# Patient Record
Sex: Female | Born: 1970 | Race: Black or African American | Hispanic: No | Marital: Single | Smoking: Current some day smoker
Health system: Southern US, Community
[De-identification: ages and names within clinical notes are randomized; demographics above are authoritative.]

## PROBLEM LIST (undated history)

## (undated) DIAGNOSIS — I341 Nonrheumatic mitral (valve) prolapse: Secondary | ICD-10-CM

## (undated) HISTORY — PX: TUMOR REMOVAL: SHX12

## (undated) HISTORY — DX: Nonrheumatic mitral (valve) prolapse: I34.1

---

## 1999-05-04 ENCOUNTER — Other Ambulatory Visit: Admission: RE | Admit: 1999-05-04 | Discharge: 1999-05-04 | Payer: Self-pay | Admitting: Family Medicine

## 2001-10-31 ENCOUNTER — Other Ambulatory Visit: Admission: RE | Admit: 2001-10-31 | Discharge: 2001-10-31 | Payer: Self-pay | Admitting: Family Medicine

## 2003-09-27 ENCOUNTER — Emergency Department (HOSPITAL_COMMUNITY): Admission: AD | Admit: 2003-09-27 | Discharge: 2003-09-28 | Payer: Self-pay | Admitting: Emergency Medicine

## 2003-12-09 ENCOUNTER — Other Ambulatory Visit: Admission: RE | Admit: 2003-12-09 | Discharge: 2003-12-09 | Payer: Self-pay | Admitting: Family Medicine

## 2004-12-21 ENCOUNTER — Other Ambulatory Visit: Admission: RE | Admit: 2004-12-21 | Discharge: 2004-12-21 | Payer: Self-pay | Admitting: Family Medicine

## 2008-02-09 ENCOUNTER — Other Ambulatory Visit: Admission: RE | Admit: 2008-02-09 | Discharge: 2008-02-09 | Payer: Self-pay | Admitting: Family Medicine

## 2009-02-20 ENCOUNTER — Other Ambulatory Visit: Admission: RE | Admit: 2009-02-20 | Discharge: 2009-02-20 | Payer: Self-pay | Admitting: Family Medicine

## 2010-05-26 ENCOUNTER — Other Ambulatory Visit: Admission: RE | Admit: 2010-05-26 | Discharge: 2010-05-26 | Payer: Self-pay | Admitting: Family Medicine

## 2012-04-21 ENCOUNTER — Other Ambulatory Visit (HOSPITAL_COMMUNITY)
Admission: RE | Admit: 2012-04-21 | Discharge: 2012-04-21 | Disposition: A | Discharge status: Home / Self Care | Payer: PRIVATE HEALTH INSURANCE | Source: Ambulatory Visit | Attending: Family Medicine | Admitting: Family Medicine

## 2012-04-21 ENCOUNTER — Other Ambulatory Visit: Payer: Self-pay | Admitting: Family Medicine

## 2012-04-21 DIAGNOSIS — Z113 Encounter for screening for infections with a predominantly sexual mode of transmission: Secondary | ICD-10-CM | POA: Insufficient documentation

## 2012-04-21 DIAGNOSIS — Z124 Encounter for screening for malignant neoplasm of cervix: Secondary | ICD-10-CM | POA: Insufficient documentation

## 2014-05-31 ENCOUNTER — Other Ambulatory Visit: Payer: Self-pay

## 2014-05-31 DIAGNOSIS — Z1231 Encounter for screening mammogram for malignant neoplasm of breast: Secondary | ICD-10-CM

## 2014-06-12 ENCOUNTER — Ambulatory Visit
Admission: RE | Admit: 2014-06-12 | Discharge: 2014-06-12 | Disposition: A | Discharge status: Home / Self Care | Payer: PRIVATE HEALTH INSURANCE | Source: Ambulatory Visit

## 2014-06-12 DIAGNOSIS — Z1231 Encounter for screening mammogram for malignant neoplasm of breast: Secondary | ICD-10-CM

## 2015-06-10 ENCOUNTER — Other Ambulatory Visit: Payer: Self-pay | Admitting: Family Medicine

## 2015-06-10 ENCOUNTER — Other Ambulatory Visit (HOSPITAL_COMMUNITY)
Admission: RE | Admit: 2015-06-10 | Discharge: 2015-06-10 | Disposition: A | Discharge status: Home / Self Care | Payer: PRIVATE HEALTH INSURANCE | Source: Ambulatory Visit | Attending: Family Medicine | Admitting: Family Medicine

## 2015-06-10 DIAGNOSIS — Z124 Encounter for screening for malignant neoplasm of cervix: Secondary | ICD-10-CM | POA: Diagnosis not present

## 2015-06-11 LAB — CYTOLOGY - PAP

## 2017-06-22 ENCOUNTER — Other Ambulatory Visit: Payer: Self-pay | Admitting: Family Medicine

## 2017-06-22 DIAGNOSIS — Z1231 Encounter for screening mammogram for malignant neoplasm of breast: Secondary | ICD-10-CM

## 2017-07-07 ENCOUNTER — Ambulatory Visit
Admission: RE | Admit: 2017-07-07 | Discharge: 2017-07-07 | Disposition: A | Discharge status: Home / Self Care | Payer: PRIVATE HEALTH INSURANCE | Source: Ambulatory Visit | Attending: Family Medicine | Admitting: Family Medicine

## 2017-07-07 DIAGNOSIS — Z1231 Encounter for screening mammogram for malignant neoplasm of breast: Secondary | ICD-10-CM

## 2017-07-08 ENCOUNTER — Ambulatory Visit: Payer: PRIVATE HEALTH INSURANCE

## 2018-06-09 ENCOUNTER — Other Ambulatory Visit: Payer: Self-pay | Admitting: Family Medicine

## 2018-06-09 DIAGNOSIS — Z1231 Encounter for screening mammogram for malignant neoplasm of breast: Secondary | ICD-10-CM

## 2018-07-21 ENCOUNTER — Ambulatory Visit
Admission: RE | Admit: 2018-07-21 | Discharge: 2018-07-21 | Disposition: A | Discharge status: Home / Self Care | Payer: PRIVATE HEALTH INSURANCE | Source: Ambulatory Visit | Attending: Family Medicine | Admitting: Family Medicine

## 2018-07-21 ENCOUNTER — Encounter: Payer: Self-pay | Admitting: Radiology

## 2018-07-21 DIAGNOSIS — Z1231 Encounter for screening mammogram for malignant neoplasm of breast: Secondary | ICD-10-CM

## 2018-07-25 ENCOUNTER — Other Ambulatory Visit: Payer: Self-pay | Admitting: Family Medicine

## 2018-07-25 ENCOUNTER — Other Ambulatory Visit (HOSPITAL_COMMUNITY)
Admission: RE | Admit: 2018-07-25 | Discharge: 2018-07-25 | Disposition: A | Discharge status: Home / Self Care | Payer: PRIVATE HEALTH INSURANCE | Source: Ambulatory Visit | Attending: Family Medicine | Admitting: Family Medicine

## 2018-07-25 DIAGNOSIS — Z124 Encounter for screening for malignant neoplasm of cervix: Secondary | ICD-10-CM | POA: Insufficient documentation

## 2018-07-27 LAB — CYTOLOGY - PAP: Diagnosis: NEGATIVE

## 2019-09-13 ENCOUNTER — Other Ambulatory Visit: Payer: Self-pay | Admitting: Family Medicine

## 2019-09-13 DIAGNOSIS — Z1231 Encounter for screening mammogram for malignant neoplasm of breast: Secondary | ICD-10-CM

## 2019-10-22 ENCOUNTER — Other Ambulatory Visit: Payer: Self-pay

## 2019-10-22 ENCOUNTER — Ambulatory Visit
Admission: RE | Admit: 2019-10-22 | Discharge: 2019-10-22 | Disposition: A | Discharge status: Home / Self Care | Payer: PRIVATE HEALTH INSURANCE | Source: Ambulatory Visit | Attending: Family Medicine | Admitting: Family Medicine

## 2019-10-22 DIAGNOSIS — Z1231 Encounter for screening mammogram for malignant neoplasm of breast: Secondary | ICD-10-CM

## 2020-10-09 ENCOUNTER — Other Ambulatory Visit: Payer: Self-pay | Admitting: Internal Medicine

## 2020-10-09 DIAGNOSIS — Z1231 Encounter for screening mammogram for malignant neoplasm of breast: Secondary | ICD-10-CM

## 2020-11-26 ENCOUNTER — Ambulatory Visit
Admission: RE | Admit: 2020-11-26 | Discharge: 2020-11-26 | Disposition: A | Discharge status: Home / Self Care | Payer: PRIVATE HEALTH INSURANCE | Source: Ambulatory Visit | Attending: Internal Medicine | Admitting: Internal Medicine

## 2020-11-26 ENCOUNTER — Other Ambulatory Visit: Payer: Self-pay

## 2020-11-26 DIAGNOSIS — Z1231 Encounter for screening mammogram for malignant neoplasm of breast: Secondary | ICD-10-CM

## 2021-03-23 ENCOUNTER — Other Ambulatory Visit: Payer: Self-pay

## 2021-03-23 ENCOUNTER — Other Ambulatory Visit (HOSPITAL_BASED_OUTPATIENT_CLINIC_OR_DEPARTMENT_OTHER): Payer: Self-pay

## 2021-03-23 ENCOUNTER — Emergency Department (HOSPITAL_BASED_OUTPATIENT_CLINIC_OR_DEPARTMENT_OTHER): Payer: No Typology Code available for payment source

## 2021-03-23 ENCOUNTER — Emergency Department (HOSPITAL_BASED_OUTPATIENT_CLINIC_OR_DEPARTMENT_OTHER)
Admission: EM | Admit: 2021-03-23 | Discharge: 2021-03-23 | Disposition: A | Discharge status: Home / Self Care | Payer: No Typology Code available for payment source | Attending: Emergency Medicine | Admitting: Emergency Medicine

## 2021-03-23 ENCOUNTER — Encounter (HOSPITAL_BASED_OUTPATIENT_CLINIC_OR_DEPARTMENT_OTHER): Payer: Self-pay

## 2021-03-23 DIAGNOSIS — F17210 Nicotine dependence, cigarettes, uncomplicated: Secondary | ICD-10-CM | POA: Insufficient documentation

## 2021-03-23 DIAGNOSIS — U071 COVID-19: Secondary | ICD-10-CM | POA: Diagnosis not present

## 2021-03-23 DIAGNOSIS — R059 Cough, unspecified: Secondary | ICD-10-CM | POA: Diagnosis present

## 2021-03-23 LAB — BASIC METABOLIC PANEL
Anion gap: 5 (ref 5–15)
BUN: 10 mg/dL (ref 6–20)
CO2: 26 mmol/L (ref 22–32)
Calcium: 8.7 mg/dL — ABNORMAL LOW (ref 8.9–10.3)
Chloride: 104 mmol/L (ref 98–111)
Creatinine, Ser: 0.79 mg/dL (ref 0.44–1.00)
GFR, Estimated: >60 mL/min (ref >60)
Glucose, Bld: 99 mg/dL (ref 70–99)
Potassium: 3.9 mmol/L (ref 3.5–5.1)
Sodium: 135 mmol/L (ref 135–145)

## 2021-03-23 MED ORDER — BENZONATATE 100 MG PO CAPS: 100.0000 mg | ORAL_CAPSULE | Freq: Three times a day (TID) | ORAL | 0 refills | Status: DC

## 2021-03-23 MED ORDER — NIRMATRELVIR/RITONAVIR (PAXLOVID)TABLET: 3.0000 | ORAL_TABLET | Freq: Two times a day (BID) | ORAL | 0 refills | Status: DC

## 2021-03-23 MED ORDER — NIRMATRELVIR/RITONAVIR (PAXLOVID)TABLET
3.0000 | ORAL_TABLET | Freq: Two times a day (BID) | ORAL | 0 refills | Status: AC
  Filled 2021-03-23: qty 30, 5d supply, fill #0

## 2021-03-23 MED ORDER — BENZONATATE 100 MG PO CAPS
100.0000 mg | ORAL_CAPSULE | Freq: Three times a day (TID) | ORAL | 0 refills | Status: DC
  Filled 2021-03-23: qty 21, 7d supply, fill #0

## 2021-03-23 NOTE — Discharge Instructions (Signed)
Please take the Paxil Oved twice daily for the next 5 days.  This medicine will help resolve symptoms with COVID, although it will not make the virus clear quicker.  He may also take the [facility] every 8 hours as needed for cough.  Continue to drink hot tea as that will soothe your throat.  Continue to take Tylenol as needed for body aches and fever.

## 2021-03-23 NOTE — ED Triage Notes (Signed)
Pt c/o cough, fever x 2 days-states she tested +covid today-pt states she is concerned she has "mitral valve prolapse"-NAD-steady gait

## 2021-03-23 NOTE — ED Provider Notes (Signed)
[facility] EMERGENCY DEPARTMENT Provider Note   CSN: [id number] Arrival date & time: 03/23/21  1543     History Chief Complaint  Patient presents with   Covid Positive    Kari Thompson is a 50 y.o. female.  HPI  Patient presents with cough, body aches x2 days.  Reports feeling lightheaded for about 10 minutes early this morning when she first got out of bed.  She has history of mitral valve prolapse which concerned her, but she is not having any chest pain or difficulty breathing.  Tylenol helps with body aches.  No chest pain, no leg swelling.  No nausea.  She is COVID vaccinated, but not boosted.  States she tested positive for COVID earlier this morning.  History reviewed. No pertinent past medical history.  There are no problems to display for this patient.   History reviewed. No pertinent surgical history.   OB History   No obstetric history on file.     Family History  Problem Relation Age of Onset   Breast cancer Neg Hx     Social History   Tobacco Use   Smoking status: Some Days    Types: Cigarettes, Cigars   Smokeless tobacco: Never  Substance Use Topics   Alcohol use: Yes    Comment: occ   Drug use: Never    Home Medications Prior to Admission medications   Not on File    Allergies    Patient has no known allergies.  Review of Systems   Review of Systems  Respiratory:  Positive for cough.   Musculoskeletal:  Positive for myalgias.   Physical Exam Updated Vital Signs BP 124/88 (BP Location: Left Arm)   Pulse 76   Temp 98.2 F (36.8 C) (Oral)   Resp 18   Ht 5\' 4"  (1.626 m)   Wt 59 kg   LMP 03/17/2021   SpO2 95%   BMI 22.31 kg/m   Physical Exam Vitals and nursing note reviewed. Exam conducted with a chaperone present.  Constitutional:      General: She is not in acute distress.    Appearance: Normal appearance.  HENT:     Head: Normocephalic and atraumatic.  Eyes:     General: No scleral icterus.    Extraocular  Movements: Extraocular movements intact.     Pupils: Pupils are equal, round, and reactive to light.  Cardiovascular:     Rate and Rhythm: Normal rate and regular rhythm.  Pulmonary:     Effort: Pulmonary effort is normal.     Breath sounds: Normal breath sounds.  Skin:    Coloration: Skin is not jaundiced.  Neurological:     Mental Status: She is alert. Mental status is at baseline.     Coordination: Coordination normal.    ED Results / Procedures / Treatments   Labs (all labs ordered are listed, but only abnormal results are displayed) Labs Reviewed  BASIC METABOLIC PANEL    EKG None  Radiology No results found.  Procedures Procedures   Medications Ordered in ED Medications - No data to display  ED Course  I have reviewed the triage vital signs and the nursing notes.  Pertinent labs & imaging results that were available during my care of the patient were reviewed by me and considered in my medical decision making (see chart for details).  Clinical Course as of 03/23/21 1753  Mon Mar 23, 2021  1740 DG Chest Portable 1 View No pneumonia  [HS]  Clinical Course User Index [HS] Theron Arista, PA-C   MDM Rules/Calculators/A&P                          Will assess for pneumonia on chest x-ray.  We will also check for arrhythmia given she had some transient lightheadedness.  I suspect this is likely due to being dehydrated from the virus.  Patient vitals are stable, chest x-ray does not show signs of pneumonia.  Will prescribe the patient Paxilovid and Tessalon Perles as needed.  Final Clinical Impression(s) / ED Diagnoses Final diagnoses:  None    Rx / DC Orders ED Discharge Orders     None        Theron Arista, [place] 03/23/21 1754    Cathren Laine, MD 03/25/21 1011

## 2021-10-09 ENCOUNTER — Ambulatory Visit
Admission: RE | Admit: 2021-10-09 | Discharge: 2021-10-09 | Disposition: A | Discharge status: Home / Self Care | Payer: No Typology Code available for payment source | Source: Ambulatory Visit | Attending: Sports Medicine | Admitting: Sports Medicine

## 2021-10-09 ENCOUNTER — Other Ambulatory Visit: Payer: Self-pay

## 2021-10-09 ENCOUNTER — Other Ambulatory Visit: Payer: Self-pay | Admitting: Sports Medicine

## 2021-10-09 DIAGNOSIS — M25561 Pain in right knee: Secondary | ICD-10-CM

## 2021-10-09 DIAGNOSIS — M25562 Pain in left knee: Secondary | ICD-10-CM

## 2022-01-28 ENCOUNTER — Other Ambulatory Visit: Payer: Self-pay | Admitting: Family Medicine

## 2022-01-28 DIAGNOSIS — Z1231 Encounter for screening mammogram for malignant neoplasm of breast: Secondary | ICD-10-CM

## 2022-02-04 ENCOUNTER — Ambulatory Visit
Admission: RE | Admit: 2022-02-04 | Discharge: 2022-02-04 | Disposition: A | Discharge status: Home / Self Care | Payer: PRIVATE HEALTH INSURANCE | Source: Ambulatory Visit | Attending: Family Medicine | Admitting: Family Medicine

## 2022-02-04 DIAGNOSIS — Z1231 Encounter for screening mammogram for malignant neoplasm of breast: Secondary | ICD-10-CM

## 2022-07-15 IMAGING — MG MM DIGITAL SCREENING BILAT W/ TOMO AND CAD
8 series · 8 of 24 positions shown · non-contrast
Comparison: Previous exam(s).

CLINICAL DATA: Screening.

EXAM:
DIGITAL SCREENING BILATERAL MAMMOGRAM WITH TOMOSYNTHESIS AND CAD
TECHNIQUE: Bilateral screening digital craniocaudal and mediolateral oblique
mammograms were obtained. Bilateral screening digital breast
tomosynthesis was performed. The images were evaluated with
computer-aided detection.

[R CC synth-2D]
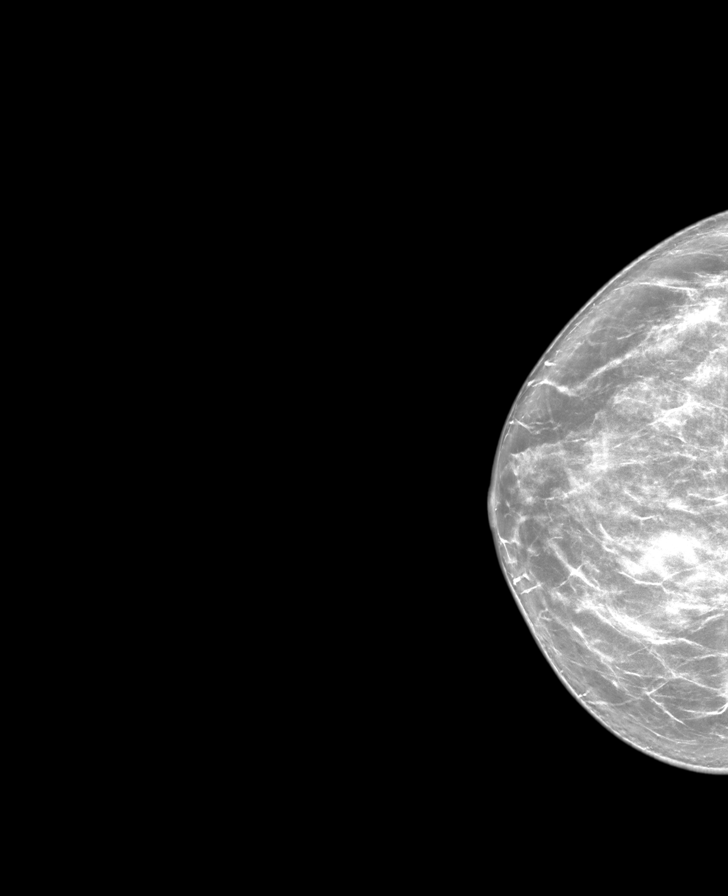

[L CC synth-2D]
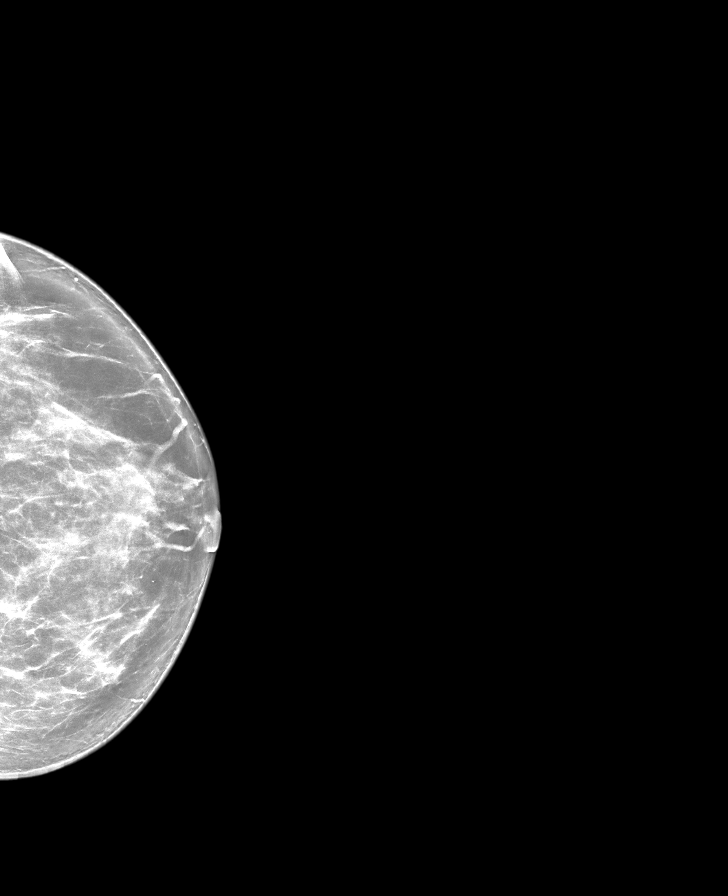

[L MLO synth-2D]
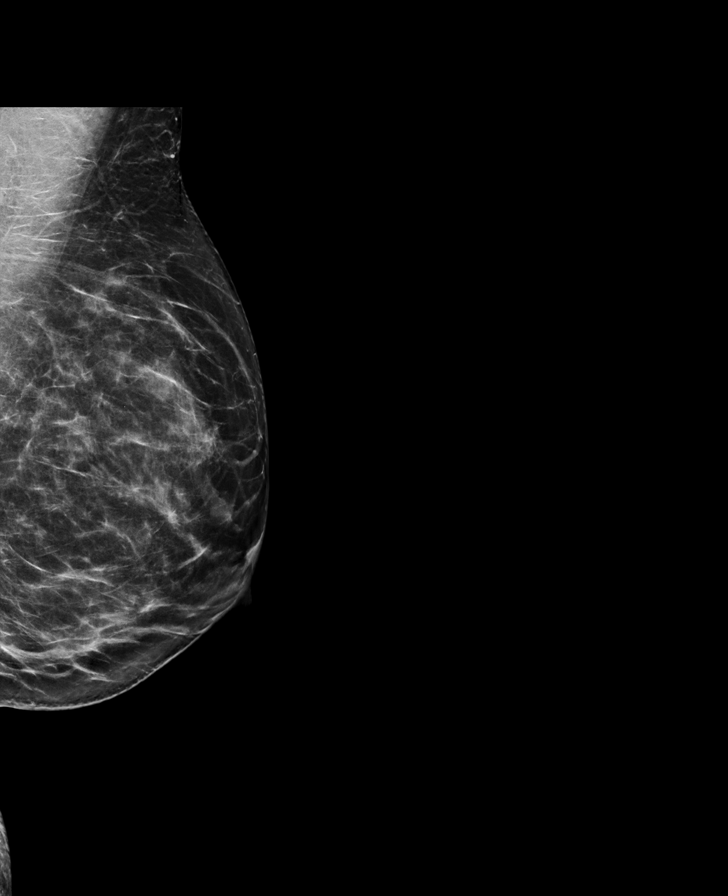

[R MLO synth-2D]
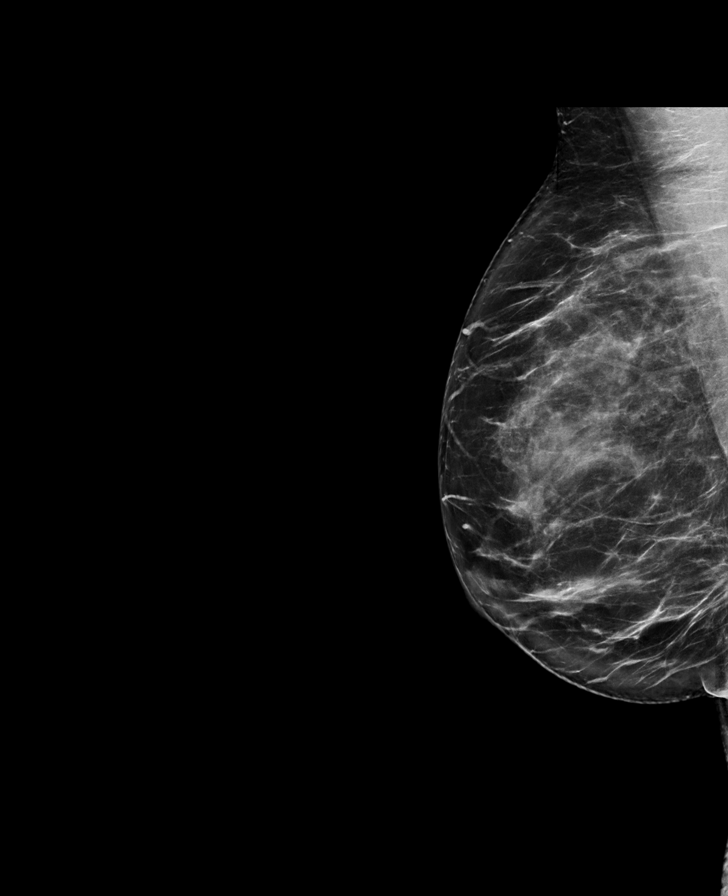

[R CC tomo · tomo slice 40/79.0]
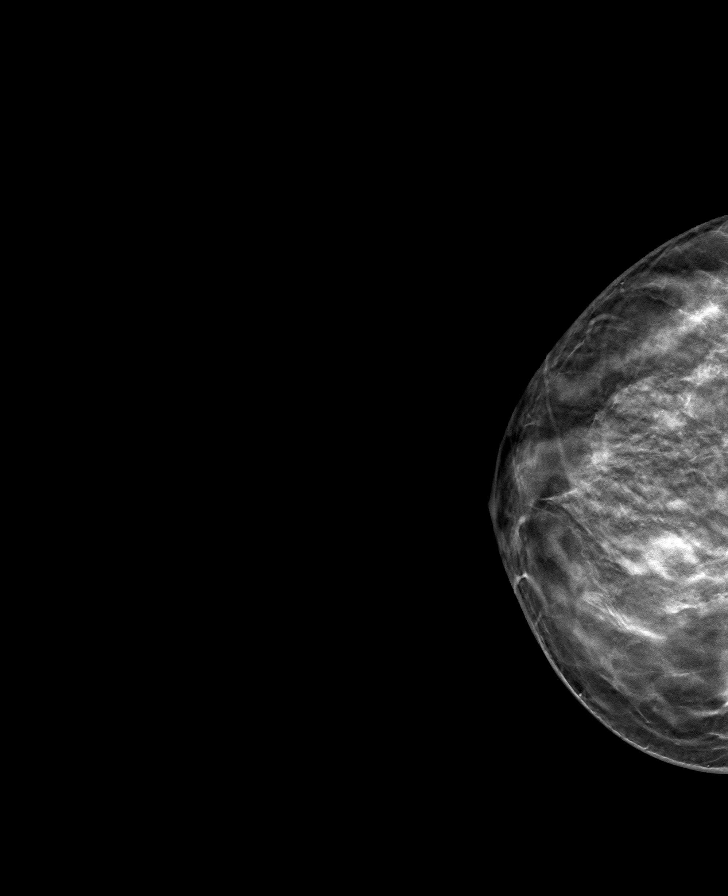

[L MLO tomo · tomo slice 39/77.0]
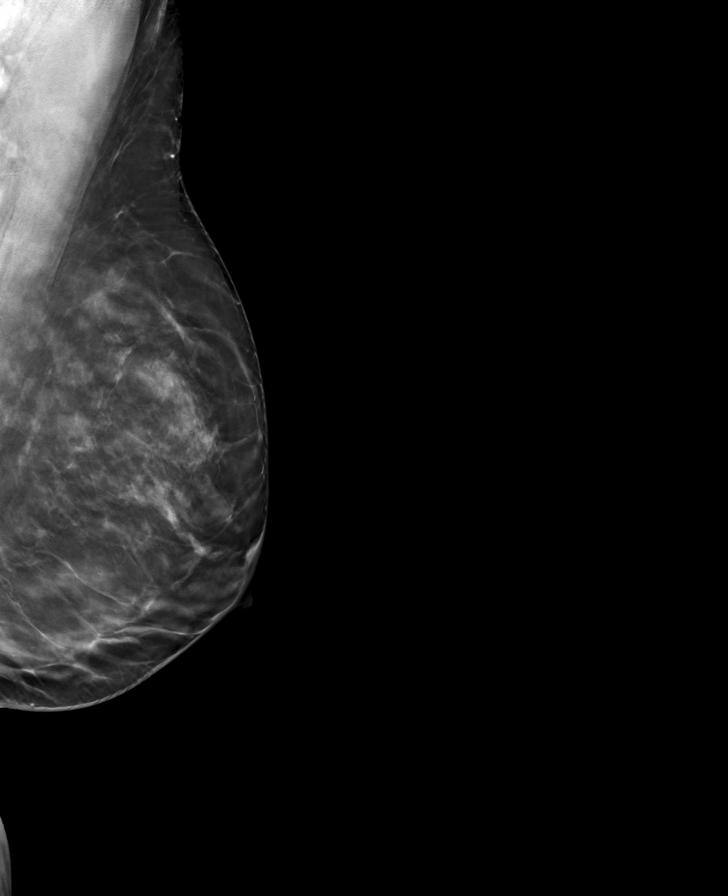

[R MLO tomo · tomo slice 43/85.0]
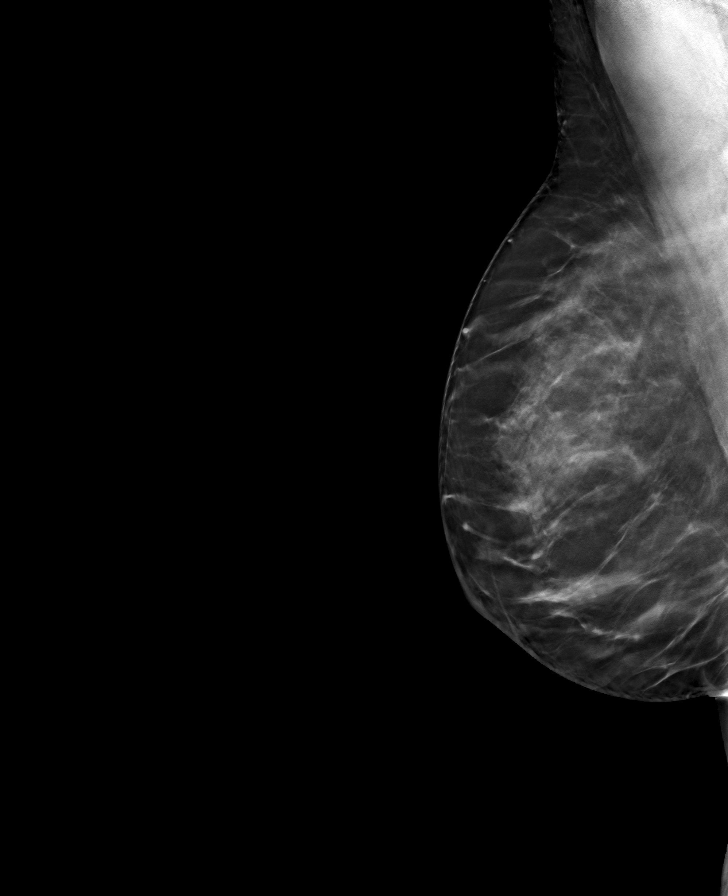

[L CC tomo · tomo slice 41/80.0]
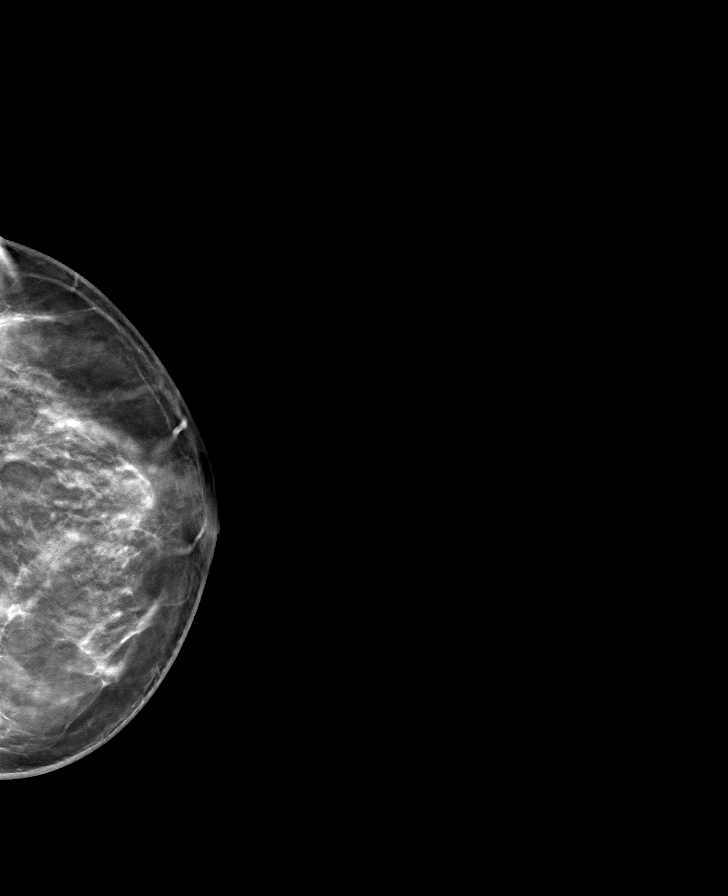

[8 of 24 positions shown; findings below may reference images not displayed]

ACR Breast Density Category c: The breast tissue is heterogeneously
dense, which may obscure small masses.
FINDINGS: There are no findings suspicious for malignancy.
IMPRESSION: No mammographic evidence of malignancy. A result letter of this
screening mammogram will be mailed directly to the patient.

RECOMMENDATION:
Screening mammogram in one year. (Code:[id number])

BI-RADS CATEGORY  1: Negative.

## 2023-02-02 NOTE — Progress Notes (Signed)
NEW PATIENT Date of Service/Encounter:  02/04/23 Referring provider: Ollen Bowl, MD Primary care provider: Maurice Small, MD (Inactive)  Subjective:  Kari Thompson is a 52 y.o. female with a PMHx presenting today for evaluation of chronic rhinitis. History obtained from: chart review and patient and fiance .   Chronic rhinitis:  Symptoms include: watery eyes, itchy eyes, sneezing, before going outside, and if she goes outside, and starts sneezing, spits up mucus, rhinorrhea, PND   Occurs year-round with seasonal flares-spring, used to get a break in the winter, no longer the case. Potential triggers: unknown, grass does tend to be a trigger Treatments tried: allegra, claritin, [facility] brands is current use, she has used Allegra-D, Claritin-D, has tried zyrtec, never tried Xyzal.  Tried flonase. Never tried montelukast. Previous allergy testing: no History of reflux/heartburn:  occasionally due to something she eats, rarely, maybe once per month She has been on prilosec for one month due to reflux, but now resolved. Previous sinus, ear, tonsil, adenoid surgeries: no  Her bug bites turn into big whelps and becomes a sore.  This will then turn into a bruise. Lasts days to weeks.  Chart review:  Referred by [facility] for allergic rhinitis on 12/24/22.  Other allergy screening: Asthma: no Food allergy: no Medication allergy: no Hymenoptera allergy: no Eczema:no History of recurrent infections suggestive of immunodeficency: no Vaccinations are up to date.   Past Medical History: Past Medical History:  Diagnosis Date   Mitral valve prolapse    Medication List:  Current Outpatient Medications  Medication Sig Dispense Refill   ascorbic Acid (VITAMIN C) 500 MG CPCR Take 1 tablet by mouth daily.     Azelastine-Fluticasone (DYMISTA) 137-50 MCG/ACT SUSP Place 1 spray into both nostrils 2 (two) times daily as needed. 23 g 5   Black Elderberry (SAMBUCUS ELDERBERRY) 50  MG/5ML SYRP Take by mouth.     ipratropium (ATROVENT) 0.06 % nasal spray Place 1-2 sprays into both nostrils 3 (three) times daily as needed for rhinitis. 15 mL 5   levocetirizine (XYZAL) 5 MG tablet Take 1 tablet (5 mg total) by mouth daily as needed for allergies. 30 tablet 5   Olopatadine HCl (PATADAY) 0.2 % SOLN Place 1 drop into both eyes daily as needed. 2.5 mL 5   No current facility-administered medications for this visit.   Known Allergies:  No Known Allergies Past Surgical History: Past Surgical History:  Procedure Laterality Date   TUMOR REMOVAL     cheek 1980   Family History: Family History  Problem Relation Age of Onset   Asthma Maternal Aunt    Allergic rhinitis Maternal Aunt    Sinusitis Maternal Aunt    Sinusitis Cousin    Breast cancer Neg Hx    Immunodeficiency Neg Hx    Urticaria Neg Hx    Atopy Neg Hx    Angioedema Neg Hx    Eczema Neg Hx    Social History: Kari Thompson lives in an apartment built 15 years ago, no water damage, carpet floors, gas heating, central AC, multipoo indoors, no roaches, not using dust mite protection on bedding or pillows, occasionally smokes cigars, 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   ROS:  All other systems negative except as noted per HPI.  Objective:  Blood pressure 108/76, pulse 68, temperature 98.1 F (36.7 C), temperature source Temporal, resp. rate 16, height 5' 3.5" (1.613 m),  weight 145 lb (65.8 kg), SpO2 98 %. Body mass index is 25.28 kg/m. Physical Exam: General Appearance:  Alert, cooperative, no distress, appears stated age  Head:  Normocephalic, without obvious abnormality, atraumatic  Eyes:  Conjunctiva clear, EOM's intact  Nose: Nares normal, hypertrophic turbinates, normal mucosa, and no visible anterior polyps  Throat: Lips, tongue normal; teeth and gums normal, normal posterior oropharynx  Neck: Supple, symmetrical   Lungs:   clear to auscultation bilaterally, Respirations unlabored, no coughing  Heart:  regular rate and rhythm and no murmur, Appears well perfused  Extremities: No edema  Skin: Erythematous papules on right forearm and Skin color, texture, turgor normal  Neurologic: No gross deficits   Diagnostics: Skin Testing: Environmental allergy panel.  Adequate controls. Results discussed with patient/family.  Airborne Adult Perc - 02/04/23 1415     Time Antigen Placed 1415    Allergen Manufacturer Waynette Buttery    Location Back    Number of Test 55    Panel 1 Select    1. Control-Buffer 50% Glycerol Negative    2. Control-Histamine 3+    3. Bahia 4+    4. [place] 4+    5. Johnson 3+    6. Kentucky Blue 4+    7. Meadow Fescue 4+    8. Perennial Rye 4+    9. Timothy 4+    10. Ragweed Mix 2+    11. Cocklebur Negative    12. Plantain,  English Negative    13. Baccharis Negative    14. Dog Fennel 3+    15. Gu[place] Thistle 2+    16. Lamb's Quarters 3+    17. Sheep Sorrell 3+    18. Rough Pigweed 3+    19. Marsh Elder, Rough Negative    20. Mugwort, Common Negative    21. Box, Elder 2+    22. Cedar, red Negative    23. Sweet Gum 3+    24. Pecan Pollen 4+    25. Pine Mix 2+    26. Walnut, Black Pollen 2+    27. Red Mulberry Negative    28. Ash Mix 3+    29. Birch Mix 2+    30. Beech American 3+    31. Cottonwood, Eastern 3+    32. Hickory, White 4+    33. Maple Mix 3+    34. Oak, Guinea[place] Mix 3+    35. Sycamore Eastern 3+    36. Alternaria Alternata 3+    37. Cladosporium Herbarum Negative    38. Aspergillus Mix Negative    39. Penicillium Mix Negative    40. Bipolaris Sorokiniana (Helminthosporium) 3+    41. Drechslera Spicifera (Curvularia) Negative    42. Mucor Plumbeus 3+    43. Fusarium Moniliforme 2+    44. Aureobasidium Pullulans (pullulara) Negative    45. Rhizopus Oryzae Negative    46. Botrytis Cinera 2+    47. Epicoccum Nigrum 3+    48. Phoma Betae Negative     49. Dust Mite Mix Negative    50. Cat Hair 10,000 BAU/ml Negative    51.  Dog Epithelia 2+    52. Mixed Feathers 2+    53. Horse Epithelia Negative    54. Cockroach, German Negative    55. Tobacco Leaf 2+             Intradermal - 02/04/23 1502     Time Antigen Placed 1503    Allergen Manufacturer Other    Location Arm  Number of Test 5    Control Negative    Mold 2 3+    Mite Mix 2+    Cat Negative    Cockroach Negative            Allergy testing results were read and interpreted by myself, documented by clinical staff.  Assessment and Plan  Chronic Rhinitis: determined to be Seasonal and Perennial Allergic: - allergy testing today: positive to grass, weed, tree pollen, dogs, dust mites, indoor/outdoor molds, mixed feathers, tobacco leaf - Prevention:  - allergen avoidance when possible - consider allergy shots as long term control of your symptoms by teaching your immune system to be more tolerant of your allergy triggers - Symptom control: - Start Dymista 1 spray each nostril up to twice daily as needed. Will replace flonase. Use for congestion/itchy nose/runny nose/sneezing. - Start Atrovent (Ipratropium Bromide) 1-2 sprays in each nostril up to 3 times a day as needed for runny nose/post nasal drip/drainage.   - Use less frequently if airway gets too dry. - Continue Antihistamine: daily or daily as needed.   -Options include Zyrtec (Cetirizine) 10mg , Claritin (Loratadine) 10mg , Allegra (Fexofenadine) 180mg , or Xyzal (Levocetirinze) 5mg  - Can be purchased over-the-counter if not covered by insurance.  Allergic Conjunctivitis:  - Start Allergy Eye drops-great options include Pataday (Olopatadine) or Zaditor (ketotifen) for eye symptoms daily as needed-both sold over the counter if not covered by insurance.  -Avoid eye drops that say red eye relief as they may contain medications that dry out your eyes.  Reflux, controlled:   - dietary and lifestyle measures,  see below  Mosquito/insect bites Avoidance measures (DEET repellant for mosquitos, long clothing, etc) If bite occurs with raised rash:  - For itch: Topical steroid (hydrocortisone cream) twice daily as needed + oral antihistamine (zyrtec) - For pain and swelling: Oral anti-inflammatory (ibuprofen), ice affected area  Follow up : 3 months, sooner if needed It was a pleasure meeting you in clinic today! Thank you for allowing me to participate in your care.  This note in its entirety was forwarded to the Provider who requested this consultation.  Thank you for your kind referral. I appreciate the opportunity to take part in Kari Thompson's care. Please do not hesitate to contact me with questions.  Sincerely,  Tonny Bollman, MD [facility]

## 2023-02-04 ENCOUNTER — Ambulatory Visit (INDEPENDENT_AMBULATORY_CARE_PROVIDER_SITE_OTHER): Payer: PRIVATE HEALTH INSURANCE | Admitting: Internal Medicine

## 2023-02-04 ENCOUNTER — Encounter: Payer: Self-pay | Admitting: Internal Medicine

## 2023-02-04 VITALS — BP 108/76 | HR 68 | Temp 98.1°F | Resp 16 | Ht 63.5 in | Wt 145.0 lb

## 2023-02-04 DIAGNOSIS — K219 Gastro-esophageal reflux disease without esophagitis: Secondary | ICD-10-CM

## 2023-02-04 DIAGNOSIS — H1013 Acute atopic conjunctivitis, bilateral: Secondary | ICD-10-CM | POA: Diagnosis not present

## 2023-02-04 DIAGNOSIS — Z91038 Other insect allergy status: Secondary | ICD-10-CM

## 2023-02-04 DIAGNOSIS — J3089 Other allergic rhinitis: Secondary | ICD-10-CM | POA: Diagnosis not present

## 2023-02-04 DIAGNOSIS — J302 Other seasonal allergic rhinitis: Secondary | ICD-10-CM | POA: Diagnosis not present

## 2023-02-04 DIAGNOSIS — J310 Chronic rhinitis: Secondary | ICD-10-CM

## 2023-02-04 DIAGNOSIS — W57XXXA Bitten or stung by nonvenomous insect and other nonvenomous arthropods, initial encounter: Secondary | ICD-10-CM

## 2023-02-04 MED ORDER — OLOPATADINE HCL 0.2 % OP SOLN: 1.0000 [drp] | Freq: Every day | OPHTHALMIC | 5 refills | Status: AC | PRN

## 2023-02-04 MED ORDER — LEVOCETIRIZINE DIHYDROCHLORIDE 5 MG PO TABS: 5.0000 mg | ORAL_TABLET | Freq: Every day | ORAL | 5 refills | Status: AC | PRN

## 2023-02-04 MED ORDER — AZELASTINE-FLUTICASONE 137-50 MCG/ACT NA SUSP: 1.0000 | Freq: Two times a day (BID) | NASAL | 5 refills | Status: AC | PRN

## 2023-02-04 MED ORDER — IPRATROPIUM BROMIDE 0.06 % NA SOLN: 1.0000 | Freq: Three times a day (TID) | NASAL | 5 refills | Status: AC | PRN

## 2023-02-04 NOTE — Patient Instructions (Addendum)
Chronic Rhinitis: determined to be Seasonal and Perennial Allergic: - allergy testing today: positive to grass, weed, tree pollen, dogs, dust mites, indoor/outdoor molds, mixed feathers, tobacco leaf - Prevention:  - allergen avoidance when possible - consider allergy shots as long term control of your symptoms by teaching your immune system to be more tolerant of your allergy triggers - Symptom control: - Start Dymista 1 spray each nostril up to twice daily as needed. Will replace flonase. Use for congestion/itchy nose/runny nose/sneezing. - Start Atrovent (Ipratropium Bromide) 1-2 sprays in each nostril up to 3 times a day as needed for runny nose/post nasal drip/drainage.   - Use less frequently if airway gets too dry. - Continue Antihistamine: daily or daily as needed.   -Options include Zyrtec (Cetirizine) 10mg , Claritin (Loratadine) 10mg , Allegra (Fexofenadine) 180mg , or Xyzal (Levocetirinze) 5mg  - Can be purchased over-the-counter if not covered by insurance.  Allergic Conjunctivitis:  - Start Allergy Eye drops-great options include Pataday (Olopatadine) or Zaditor (ketotifen) for eye symptoms daily as needed-both sold over the counter if not covered by insurance.  -Avoid eye drops that say red eye relief as they may contain medications that dry out your eyes.  Reflux, controlled:   - dietary and lifestyle measures, see below  Mosquito/insect bites Avoidance measures (DEET repellant for mosquitos, long clothing, etc) If bite occurs with raised rash:  - For itch: Topical steroid (hydrocortisone cream) twice daily as needed + oral antihistamine (zyrtec) - For pain and swelling: Oral anti-inflammatory (ibuprofen), ice affected area         Follow up : 3 months, sooner if needed It was a pleasure meeting you in clinic today! Thank you for allowing me to participate in your care.  Gastroesophageal Reflux Induced Respiratory Disease and Laryngopharyngeal Reflux  (LPR): Gastroesophageal reflux disease (GERD) is a condition where the contents of the stomach reflux or back up into the esophagus or swallowing tube.  This can result in a variety of clinical symptoms including classic symptoms and atypical symptoms.  Classic symptoms of GERD include: heartburn, chest pain, acid taste in the mouth, and difficulty in swallowing.  Atypical symptoms of GERD include laryngopharyngeal reflux (LPR) and asthma.  LPR occurs when stomach reflux comes all the way up to the throat.  Clinical symptoms include hoarseness, raspy voice, laryngitis, throat clearing, postnasal drip, mucus stuck in the throat, a sensation of a lump in the throat, sore throat, and cough.  Most patients with LPR do not have classic symptoms of GERD.  Asthma can also be triggered by GERD.  The acid stomach fluid can stimulate nerve fibers in the esophagus which can cause an increase in bronchial muscle tone and narrowing of the airways.  Acid stomach contents may also reflux into the trachea and bronchi of the lungs where it can trigger an asthma attack.  Many people with GERD triggered asthma do not have classic symptoms of GERD.  Diagnosis of LPR and GERD induced asthma is frequently made from a typical history and response to medications.  It may take several months of medications to see a good response.  Occasionally, a 24-hour esophageal pH probe study must be performed.  Treatment of GERD/LPR includes:   Modification of diet and lifestyle Stop smoking Avoid overeating and lose weight Avoid acidic and fatty foods, chocolate, onions, garlic, peppermint Elevate the head of your bed 6 to 8 inches with blocks or wedge Medications Zantac, Pepcid, Axid, Tagamet Prilosec, Prevacid, Aciphex, Protonix, Nexium Surgery   Reducing Pollen Exposure  The American Academy of Allergy, Asthma and Immunology suggests the following steps to reduce your exposure to pollen during allergy seasons.    Do not  hang sheets or clothing out to dry; pollen may collect on these items. Do not mow lawns or spend time around freshly cut grass; mowing stirs up pollen. Keep windows closed at night.  Keep car windows closed while driving. Minimize morning activities outdoors, a time when pollen counts are usually at their highest. Stay indoors as much as possible when pollen counts or humidity is high and on windy days when pollen tends to remain in the air longer. Use air conditioning when possible.  Many air conditioners have filters that trap the pollen spores. Use a HEPA room air filter to remove pollen form the indoor air you breathe. Control of Dog or Cat Allergen  Avoidance is the best way to manage a dog or cat allergy. If you have a dog or cat and are allergic to dog or cats, consider removing the dog or cat from the home. If you have a dog or cat but don't want to find it a new home, or if your family wants a pet even though someone in the household is allergic, here are some strategies that may help keep symptoms at bay:  Keep the pet out of your bedroom and restrict it to only a few rooms. Be advised that keeping the dog or cat in only one room will not limit the allergens to that room. Don't pet, hug or kiss the dog or cat; if you do, wash your hands with soap and water. High-efficiency particulate air (HEPA) cleaners run continuously in a bedroom or living room can reduce allergen levels over time. Regular use of a high-efficiency vacuum cleaner or a central vacuum can reduce allergen levels. Giving your dog or cat a bath at least once a week can reduce airborne allergen. DUST MITE AVOIDANCE MEASURES:  There are three main measures that need and can be taken to avoid house dust mites:  Reduce accumulation of dust in general -reduce furniture, clothing, carpeting, books, stuffed animals, especially in bedroom  Separate yourself from the dust -use pillow and mattress encasements (can be found at  stores such as [employer] or online) -avoid direct exposure to air condition flow -use a HEPA filter device, especially in the bedroom; you can also use a HEPA filter vacuum cleaner -wipe dust with a moist towel instead of a dry towel or broom when cleaning  Decrease mites and/or their secretions -wash clothing and linen and stuffed animals at highest temperature possible, at least every 2 weeks -stuffed animals can also be placed in a bag and put in a freezer overnight  Despite the above measures, it is impossible to eliminate dust mites or their allergen completely from your home.  With the above measures the burden of mites in your home can be diminished, with the goal of minimizing your allergic symptoms.  Success will be reached only when implementing and using all means together. Control of Mold Allergen   Mold and fungi can grow on a variety of surfaces provided certain temperature and moisture conditions exist.  Outdoor molds grow on plants, decaying vegetation and soil.  The major outdoor mold, Alternaria and Cladosporium, are found in very high numbers during hot and dry conditions.  Generally, a late Summer - Fall peak is seen for common outdoor fungal spores.  Rain will temporarily lower outdoor mold spore count, but counts  rise rapidly when the rainy period ends.  The most important indoor molds are Aspergillus and Penicillium.  Dark, humid and poorly ventilated basements are ideal sites for mold growth.  The next most common sites of mold growth are the bathroom and the kitchen.  Outdoor (Seasonal) Mold Control  Use air conditioning and keep windows closed Avoid exposure to decaying vegetation. Avoid leaf raking. Avoid grain handling. Consider wearing a face mask if working in moldy areas.    Indoor (Perennial) Mold Control   Maintain humidity below 50%. Clean washable surfaces with 5% bleach solution. Remove sources e.g. contaminated carpets.

## 2023-05-13 ENCOUNTER — Ambulatory Visit: Payer: PRIVATE HEALTH INSURANCE | Admitting: Internal Medicine

## 2023-05-13 ENCOUNTER — Encounter: Payer: Self-pay | Admitting: Internal Medicine

## 2023-05-13 VITALS — BP 110/80 | HR 78 | Temp 97.9°F

## 2023-05-13 DIAGNOSIS — K219 Gastro-esophageal reflux disease without esophagitis: Secondary | ICD-10-CM

## 2023-05-13 DIAGNOSIS — J302 Other seasonal allergic rhinitis: Secondary | ICD-10-CM

## 2023-05-13 DIAGNOSIS — H1013 Acute atopic conjunctivitis, bilateral: Secondary | ICD-10-CM

## 2023-05-13 DIAGNOSIS — J3089 Other allergic rhinitis: Secondary | ICD-10-CM | POA: Diagnosis not present

## 2023-05-13 MED ORDER — EPINEPHRINE 0.3 MG/0.3ML IJ SOAJ: 0.3000 mg | INTRAMUSCULAR | 1 refills | Status: AC | PRN

## 2023-05-13 NOTE — Patient Instructions (Addendum)
Seasonal and Perennial Allergic Rhinitis - allergy testing 2024 positive to grass, weed, tree pollen, dogs, dust mites, indoor/outdoor molds, mixed feathers, tobacco leaf - Prevention:  - allergen avoidance when possible - consider allergy shots as long term control of your symptoms by teaching your immune system to be more tolerant of your allergy triggers - Symptom control: - Continue Dymista 1 spray each nostril up to twice daily as needed. Will replace flonase. Use for congestion/itchy nose/runny nose/sneezing. - Continue Atrovent (Ipratropium Bromide) 1-2 sprays in each nostril up to 3 times a day as needed for runny nose/post nasal drip/drainage.   - Use less frequently if airway gets too dry. - Continue Xyzal (Levocetirinze) 5mg  daily - Can be purchased over-the-counter if not covered by insurance.  Allergic Conjunctivitis:  - Continue Allergy Eye drops-great options include Pataday (Olopatadine) or Zaditor (ketotifen) for eye symptoms daily as needed-both sold over the counter if not covered by insurance.  -Avoid eye drops that say red eye relief as they may contain medications that dry out your eyes.  Reflux, controlled:   - dietary and lifestyle measures, see below  Mosquito/insect bites Avoidance measures (DEET repellant for mosquitos, long clothing, etc) If bite occurs with raised rash:  - For itch: Topical steroid (hydrocortisone cream) twice daily as needed + oral antihistamine (zyrtec) - For pain and swelling: Oral anti-inflammatory (ibuprofen), ice affected area      Follow up : 6 months, sooner if needed Call us when you are ready to schedule RUSH-packet given today. It was a pleasure seeing you again in clinic today! Thank you for allowing me to participate in your care.  Gastroesophageal Reflux Induced Respiratory Disease and Laryngopharyngeal Reflux (LPR): Gastroesophageal reflux disease (GERD) is a condition where the contents of the stomach reflux or back up into  the esophagus or swallowing tube.  This can result in a variety of clinical symptoms including classic symptoms and atypical symptoms.  Classic symptoms of GERD include: heartburn, chest pain, acid taste in the mouth, and difficulty in swallowing.  Atypical symptoms of GERD include laryngopharyngeal reflux (LPR) and asthma.  LPR occurs when stomach reflux comes all the way up to the throat.  Clinical symptoms include hoarseness, raspy voice, laryngitis, throat clearing, postnasal drip, mucus stuck in the throat, a sensation of a lump in the throat, sore throat, and cough.  Most patients with LPR do not have classic symptoms of GERD.  Asthma can also be triggered by GERD.  The acid stomach fluid can stimulate nerve fibers in the esophagus which can cause an increase in bronchial muscle tone and narrowing of the airways.  Acid stomach contents may also reflux into the trachea and bronchi of the lungs where it can trigger an asthma attack.  Many people with GERD triggered asthma do not have classic symptoms of GERD.  Diagnosis of LPR and GERD induced asthma is frequently made from a typical history and response to medications.  It may take several months of medications to see a good response.  Occasionally, a 24-hour esophageal pH probe study must be performed.  Treatment of GERD/LPR includes:   Modification of diet and lifestyle Stop smoking Avoid overeating and lose weight Avoid acidic and fatty foods, chocolate, onions, garlic, peppermint Elevate the head of your bed 6 to 8 inches with blocks or wedge Medications Zantac, Pepcid, Axid, Tagamet Prilosec, Prevacid, Aciphex, Protonix, Nexium Surgery   Reducing Pollen Exposure  The American Academy of Allergy, Asthma and Immunology suggests the following steps to  reduce your exposure to pollen during allergy seasons.    Do not hang sheets or clothing out to dry; pollen may collect on these items. Do not mow lawns or spend time around freshly cut  grass; mowing stirs up pollen. Keep windows closed at night.  Keep car windows closed while driving. Minimize morning activities outdoors, a time when pollen counts are usually at their highest. Stay indoors as much as possible when pollen counts or humidity is high and on windy days when pollen tends to remain in the air longer. Use air conditioning when possible.  Many air conditioners have filters that trap the pollen spores. Use a HEPA room air filter to remove pollen form the indoor air you breathe. Control of Dog or Cat Allergen  Avoidance is the best way to manage a dog or cat allergy. If you have a dog or cat and are allergic to dog or cats, consider removing the dog or cat from the home. If you have a dog or cat but don't want to find it a new home, or if your family wants a pet even though someone in the household is allergic, here are some strategies that may help keep symptoms at bay:  Keep the pet out of your bedroom and restrict it to only a few rooms. Be advised that keeping the dog or cat in only one room will not limit the allergens to that room. Don't pet, hug or kiss the dog or cat; if you do, wash your hands with soap and water. High-efficiency particulate air (HEPA) cleaners run continuously in a bedroom or living room can reduce allergen levels over time. Regular use of a high-efficiency vacuum cleaner or a central vacuum can reduce allergen levels. Giving your dog or cat a bath at least once a week can reduce airborne allergen. DUST MITE AVOIDANCE MEASURES:  There are three main measures that need and can be taken to avoid house dust mites:  Reduce accumulation of dust in general -reduce furniture, clothing, carpeting, books, stuffed animals, especially in bedroom  Separate yourself from the dust -use pillow and mattress encasements (can be found at stores such as [employer] or online) -avoid direct exposure to air condition flow -use a HEPA filter device,  especially in the bedroom; you can also use a HEPA filter vacuum cleaner -wipe dust with a moist towel instead of a dry towel or broom when cleaning  Decrease mites and/or their secretions -wash clothing and linen and stuffed animals at highest temperature possible, at least every 2 weeks -stuffed animals can also be placed in a bag and put in a freezer overnight  Despite the above measures, it is impossible to eliminate dust mites or their allergen completely from your home.  With the above measures the burden of mites in your home can be diminished, with the goal of minimizing your allergic symptoms.  Success will be reached only when implementing and using all means together. Control of Mold Allergen   Mold and fungi can grow on a variety of surfaces provided certain temperature and moisture conditions exist.  Outdoor molds grow on plants, decaying vegetation and soil.  The major outdoor mold, Alternaria and Cladosporium, are found in very high numbers during hot and dry conditions.  Generally, a late Summer - Fall peak is seen for common outdoor fungal spores.  Rain will temporarily lower outdoor mold spore count, but counts rise rapidly when the rainy period ends.  The most important indoor molds  are Aspergillus and Penicillium.  Dark, humid and poorly ventilated basements are ideal sites for mold growth.  The next most common sites of mold growth are the bathroom and the kitchen.  Outdoor (Seasonal) Mold Control  Use air conditioning and keep windows closed Avoid exposure to decaying vegetation. Avoid leaf raking. Avoid grain handling. Consider wearing a face mask if working in moldy areas.    Indoor (Perennial) Mold Control   Maintain humidity below 50%. Clean washable surfaces with 5% bleach solution. Remove sources e.g. contaminated carpets.

## 2023-05-13 NOTE — Progress Notes (Signed)
FOLLOW UP Date of Service/Encounter:  05/13/23  Subjective:  Kari Thompson (DOB: May 30, 1971) is a 52 y.o. female who [facility] in re-evaluation of the following: allergic rhinitis, GERD History obtained from: chart review and patient.  For Review, LV was on 02/04/23  with Dr.Azyah Flett seen for intial visit for allergic rhinitis and conjunctivitis, reflux, and localized reactions to mosquito bites . See below for summary of history and diagnostics.   Therapeutic plans/changes recommended: Start Xyzal, Dymista nasal spray, and Atrovent nasal spray as needed, consider allergy injections. ----------------------------------------------------- Pertinent History/Diagnostics:  Allergic Rhinitis:  watery eyes, itchy eyes, sneezing, before going outside, and if she goes outside, and starts sneezing, spits up mucus, rhinorrhea, PND   Occurs year-round with seasonal flares-spring Tried: allegra, claritin,  Allegra-D, Claritin-D, zyrtec, flonase Current meds: xyzal, dymista, atrovent nasal spray. - SPT environmental panel (02/04/2023): positive to grass, weed, tree pollen, dogs, dust mites, indoor/outdoor molds, mixed feathers, tobacco leaf  Other: reflux, controlled on diet, large local reactions with mosquito bites --------------------------------------------------- Today presents for follow-up. She is taking xyzal every other day usually. Will use dymista when having a bad day. Not having any headaches, eyes are not constantly watery and she is not sneezing as much. Not using eye drops often. She feels xyzal is helpful.  She is interested in starting allergy injections, and would like to start these prior to the following spring.  She would be interested in doing Rush so that she is prepared for springtime next year.  All medications reviewed by clinical staff and updated in chart. No new pertinent medical or surgical history except as noted in HPI.  ROS:  All others negative except as noted per HPI.   Objective:  BP 110/80   Pulse 78   Temp 97.9 F (36.6 C) (Temporal)   SpO2 98%  There is no height or weight on file to calculate BMI. Physical Exam: General Appearance:  Alert, cooperative, no distress, appears stated age  Head:  Normocephalic, without obvious abnormality, atraumatic  Eyes:  Conjunctiva clear, EOM's intact  Ears EACs normal bilaterally and normal TMs bilaterally  Nose: Nares normal, hypertrophic turbinates, normal mucosa, and no visible anterior polyps  Throat: Lips, tongue normal; teeth and gums normal, normal posterior oropharynx  Neck: Supple, symmetrical  Lungs:   clear to auscultation bilaterally, Respirations unlabored, no coughing  Heart:  regular rate and rhythm and no murmur, Appears well perfused  Extremities: No edema  Skin: Skin color, texture, turgor normal and no rashes or lesions on visualized portions of skin  Neurologic: No gross deficits   Labs:  Lab Orders  No laboratory test(s) ordered today   Assessment/Plan   Seasonal and Perennial Allergic Rhinitis-not at goal but improved - allergy testing 2024 positive to grass, weed, tree pollen, dogs, dust mites, indoor/outdoor molds, mixed feathers, tobacco leaf - Prevention:  - allergen avoidance when possible - consider allergy shots as long term control of your symptoms by teaching your immune system to be more tolerant of your allergy triggers - Symptom control: - Continue Dymista 1 spray each nostril up to twice daily as needed. Will replace flonase. Use for congestion/itchy nose/runny nose/sneezing. - Continue Atrovent (Ipratropium Bromide) 1-2 sprays in each nostril up to 3 times a day as needed for runny nose/post nasal drip/drainage.   - Use less frequently if airway gets too dry. - Continue Xyzal (Levocetirinze) 5mg  daily - Can be purchased over-the-counter if not covered by insurance.  Allergic Conjunctivitis:  - Continue Allergy Eye  drops-great options include Pataday (Olopatadine) or Zaditor (ketotifen) for eye symptoms daily as needed-both sold over the counter if not covered by insurance.  -Avoid eye drops that say red eye relief as they may contain medications that dry out your eyes.  Reflux, controlled:   - dietary and lifestyle measures, see below  Mosquito/insect bites-stable Avoidance measures (DEET repellant for mosquitos, long clothing, etc) If bite occurs with raised rash:  - For itch: Topical steroid (hydrocortisone cream) twice daily as needed + oral antihistamine (zyrtec) - For pain and swelling: Oral anti-inflammatory (ibuprofen), ice affected area      Follow up : 6 months, sooner if needed Call us when you are ready to schedule RUSH-packet given today. It was a pleasure seeing you again in clinic today! Thank you for allowing me to participate in your care.  Other:epipen prescribed in anticipation of starting allergy injections. Will need to bring to injection appointments.  Tonny Bollman, MD  [facility]

## 2023-10-24 ENCOUNTER — Other Ambulatory Visit: Payer: Self-pay | Admitting: Internal Medicine

## 2023-10-24 DIAGNOSIS — Z1231 Encounter for screening mammogram for malignant neoplasm of breast: Secondary | ICD-10-CM

## 2023-11-16 ENCOUNTER — Ambulatory Visit
Admission: RE | Admit: 2023-11-16 | Discharge: 2023-11-16 | Disposition: A | Discharge status: Home / Self Care | Payer: PRIVATE HEALTH INSURANCE | Source: Ambulatory Visit | Attending: Internal Medicine

## 2023-11-16 DIAGNOSIS — Z1231 Encounter for screening mammogram for malignant neoplasm of breast: Secondary | ICD-10-CM

## 2024-01-09 ENCOUNTER — Emergency Department (HOSPITAL_BASED_OUTPATIENT_CLINIC_OR_DEPARTMENT_OTHER): Payer: PRIVATE HEALTH INSURANCE

## 2024-01-09 ENCOUNTER — Encounter (HOSPITAL_BASED_OUTPATIENT_CLINIC_OR_DEPARTMENT_OTHER): Payer: Self-pay | Admitting: Emergency Medicine

## 2024-01-09 ENCOUNTER — Emergency Department (HOSPITAL_BASED_OUTPATIENT_CLINIC_OR_DEPARTMENT_OTHER)
Admission: EM | Admit: 2024-01-09 | Discharge: 2024-01-10 | Disposition: A | Discharge status: Home / Self Care | Payer: PRIVATE HEALTH INSURANCE | Attending: Emergency Medicine | Admitting: Emergency Medicine

## 2024-01-09 ENCOUNTER — Other Ambulatory Visit: Payer: Self-pay

## 2024-01-09 DIAGNOSIS — R052 Subacute cough: Secondary | ICD-10-CM | POA: Insufficient documentation

## 2024-01-09 DIAGNOSIS — R7989 Other specified abnormal findings of blood chemistry: Secondary | ICD-10-CM | POA: Insufficient documentation

## 2024-01-09 LAB — COMPREHENSIVE METABOLIC PANEL WITH GFR
ALT: 14 U/L (ref 0–44)
AST: 23 U/L (ref 15–41)
Albumin: 4.3 g/dL (ref 3.5–5.0)
Alkaline Phosphatase: 77 U/L (ref 38–126)
Anion gap: 8 (ref 5–15)
BUN: 12 mg/dL (ref 6–20)
CO2: 26 mmol/L (ref 22–32)
Calcium: 9.8 mg/dL (ref 8.9–10.3)
Chloride: 103 mmol/L (ref 98–111)
Creatinine, Ser: 1.13 mg/dL — ABNORMAL HIGH (ref 0.44–1.00)
GFR, Estimated: 58 mL/min — ABNORMAL LOW (ref >60)
Glucose, Bld: 83 mg/dL (ref 70–99)
Potassium: 3.8 mmol/L (ref 3.5–5.1)
Sodium: 137 mmol/L (ref 135–145)
Total Bilirubin: <0.2 mg/dL (ref 0.0–1.2)
Total Protein: 7.3 g/dL (ref 6.5–8.1)

## 2024-01-09 LAB — CBC WITH DIFFERENTIAL/PLATELET
Abs Immature Granulocytes: 0.01 10*3/uL (ref 0.00–0.07)
Basophils Absolute: 0 10*3/uL (ref 0.0–0.1)
Basophils Relative: 1 %
Eosinophils Absolute: 0.1 10*3/uL (ref 0.0–0.5)
Eosinophils Relative: 2 %
HCT: 37 % (ref 36.0–46.0)
Hemoglobin: 12.2 g/dL (ref 12.0–15.0)
Immature Granulocytes: 0 %
Lymphocytes Relative: 34 %
Lymphs Abs: 1.8 10*3/uL (ref 0.7–4.0)
MCH: 26.1 pg (ref 26.0–34.0)
MCHC: 33 g/dL (ref 30.0–36.0)
MCV: 79.2 fL — ABNORMAL LOW (ref 80.0–100.0)
Monocytes Absolute: 0.6 10*3/uL (ref 0.1–1.0)
Monocytes Relative: 11 %
Neutro Abs: 2.7 10*3/uL (ref 1.7–7.7)
Neutrophils Relative %: 52 %
Platelets: 234 10*3/uL (ref 150–400)
RBC: 4.67 MIL/uL (ref 3.87–5.11)
RDW: 13.5 % (ref 11.5–15.5)
Smear Review: NORMAL
WBC: 5.6 10*3/uL (ref 4.0–10.5)
nRBC: 0 % (ref 0.0–0.2)

## 2024-01-09 LAB — RESP PANEL BY RT-PCR (RSV, FLU A&B, COVID)  RVPGX2
Influenza A by PCR: NEGATIVE
Influenza B by PCR: NEGATIVE
Resp Syncytial Virus by PCR: NEGATIVE
SARS Coronavirus 2 by RT PCR: NEGATIVE

## 2024-01-09 NOTE — ED Triage Notes (Signed)
 Pt was at work retreat one week ago, has felt unwell since. Reports ongoing productive cough.  Denies fever.

## 2024-01-09 NOTE — Discharge Instructions (Addendum)
 You were evaluated in the emergency room for a worsening cough.your lab work and chest x-ray did not show any significant findings.  Your respiratory panel was negative.  You may use Tylenol 1000 mg and/or Motrin 600 mg every 4-6 hours up to 3 times a day for fever or body aches.  Please keep in mind that this dosing is not meant to be continued long-term and that many over-the-counter cough and flu medications contain acetaminophen or ibuprofen. You can expect your current symptoms to linger over the next week or two but please return to the emergency room if you experience any new or worsening symptoms including persistent fevers, worsening productive cough and persistent vomiting. Please follow-up with your primary care provider regarding your ER visit.  It is your responsibility to obtain any results from your visit and review any findings with your primary care doctor.

## 2024-01-09 NOTE — ED Provider Notes (Signed)
  EMERGENCY DEPARTMENT AT [facility] [facility] Provider Note   CSN: [id number] Arrival date & time: 01/09/24  2058     History  Chief Complaint  Patient presents with   Cough    Kari Thompson is a 53 y.o. female who presents with worsening cough.  Patient denies any fevers or chills.  Does endorse congestion and bodyaches.  Cough is described as mostly nonproductive.  No vomiting or diarrhea.  No abdominal pain.  Does not report chest pain or shortness of breath.  History of respiratory issues.   Cough  Past Medical History:  Diagnosis Date   Mitral valve prolapse    Past Surgical History:  Procedure Laterality Date   TUMOR REMOVAL     cheek 1980       Home Medications Prior to Admission medications   Medication Sig Start Date End Date Taking? Authorizing Provider  ascorbic Acid (VITAMIN C) 500 MG CPCR Take 1 tablet by mouth daily.    [provider]  Azelastine -Fluticasone  (DYMISTA ) 137-50 MCG/ACT SUSP Place 1 spray into both nostrils 2 (two) times daily as needed. 02/04/23   Sean Czar, MD  Black Elderberry (SAMBUCUS ELDERBERRY) 50 MG/5ML SYRP Take by mouth. 05/26/21   [provider]  EPINEPHrine  (EPIPEN  2-PAK) 0.3 mg/0.3 mL IJ SOAJ injection Inject 0.3 mg into the muscle as needed for anaphylaxis. 05/13/23   Sean Czar, MD  ipratropium (ATROVENT ) 0.06 % nasal spray Place 1-2 sprays into both nostrils 3 (three) times daily as needed for rhinitis. 02/04/23   Sean Czar, MD  levocetirizine (XYZAL ) 5 MG tablet Take 1 tablet (5 mg total) by mouth daily as needed for allergies. 02/04/23   Sean Czar, MD  Olopatadine  HCl (PATADAY ) 0.2 % SOLN Place 1 drop into both eyes daily as needed. 02/04/23   Sean Czar, MD      Allergies    Patient has no known allergies.    Review of Systems   Review of Systems  Respiratory:  Positive for cough.     Physical Exam Updated Vital Signs BP (!) 141/93   Pulse 84   Temp (!) 96.6 F (35.9 C)    Resp 15   Ht 5' 3.5" (1.613 m)   Wt 64.4 kg   LMP 01/06/2024 (Exact Date)   SpO2 100%   BMI 24.76 kg/m  Physical Exam Vitals and nursing note reviewed.  Constitutional:      General: She is not in acute distress.    Appearance: She is well-developed.  HENT:     Head: Normocephalic and atraumatic.  Eyes:     Conjunctiva/sclera: Conjunctivae normal.  Cardiovascular:     Rate and Rhythm: Normal rate and regular rhythm.     Heart sounds: No murmur heard. Pulmonary:     Effort: Pulmonary effort is normal. No respiratory distress.     Breath sounds: Normal breath sounds.  Abdominal:     Palpations: Abdomen is soft.     Tenderness: There is no abdominal tenderness.  Musculoskeletal:        General: No swelling.     Cervical back: Neck supple.  Skin:    General: Skin is warm and dry.     Capillary Refill: Capillary refill takes less than 2 seconds.  Neurological:     Mental Status: She is alert.  Psychiatric:        Mood and Affect: Mood normal.     ED Results / Procedures / Treatments  Labs (all labs ordered are listed, but only abnormal results are displayed) Labs Reviewed  CBC WITH DIFFERENTIAL/PLATELET - Abnormal; Notable for the following components:      Result Value   MCV 79.2 (*)    All other components within normal limits  COMPREHENSIVE METABOLIC PANEL WITH GFR - Abnormal; Notable for the following components:   Creatinine, Ser 1.13 (*)    GFR, Estimated 58 (*)    All other components within normal limits  RESP PANEL BY RT-PCR (RSV, FLU A&B, COVID)  [id number]    EKG None  Radiology DG Chest 2 View Result Date: 01/09/2024 CLINICAL DATA:  Cough EXAM: CHEST - 2 VIEW COMPARISON:  03/23/2021 FINDINGS: The heart size and mediastinal contours are within normal limits. Both lungs are clear. The visualized skeletal structures are unremarkable. IMPRESSION: No active cardiopulmonary disease. Electronically Signed   By: Janeece Mechanic M.D.   On: 01/09/2024 22:57     Procedures Procedures    Medications Ordered in ED Medications - No data to display  ED Course/ Medical Decision Making/ A&P                                 Medical Decision Making Amount and/or Complexity of Data Reviewed Labs: ordered. Radiology: ordered.   This patient presents to the ED with chief complaint(s) of cough.  The complaint involves an extensive differential diagnosis and also carries with it a high risk of complications and morbidity.   Pertinent past medical history as listed in HPI  The differential diagnosis includes  Pneumonia, URI, asthma, COPD Additional history obtained: Records reviewed Care Everywhere/External Records  Assessment and management:   Hemodynamically stable, nontoxic-appearing patient presenting with worsening cough over the past week.  Endorses other URI symptoms including congestion and bodyaches.  Not associated with any vomiting or diarrhea.  Her abdomen is nontender.  Her lungs sound clear and additionally she is afebrile.  However given age with worsening cough will obtain basic labs and chest x-ray.  CMP notable for elevated creatinine.  Patient tolerating p.o. will recommend p.o. fluids.  CBC without significant findings.  Chest x-ray negative.  Discussed findings with patient.  She is amenable to discharge.  Independent ECG interpretation:  none  Independent labs interpretation:  The following labs were independently interpreted:  CBC without significant abnormality, CMP with elevated creatinine to 1.13, baseline 0.79   Independent visualization and interpretation of imaging: I independently visualized the following imaging with scope of interpretation limited to determining acute life threatening conditions related to emergency care:  Chest x-ray   Consultations obtained:   none  Disposition:   Patient will be discharged home. The patient has been appropriately medically screened and/or stabilized in the ED. I have  low suspicion for any other emergent medical condition which would require further screening, evaluation or treatment in the ED or require inpatient management. At time of discharge the patient is hemodynamically stable and in no acute distress. I have discussed work-up results and diagnosis with patient and answered all questions. Patient is agreeable with discharge plan. We discussed strict return precautions for returning to the emergency department and they verbalized understanding.     Social Determinants of Health:   none  This note was dictated with voice recognition software.  Despite best efforts at proofreading, errors may have occurred which can change the documentation meaning.          Final Clinical Impression(s) /  ED Diagnoses Final diagnoses:  Subacute cough    Rx / DC Orders ED Discharge Orders     None         Felicie Horning, PA-C 01/09/24 2350    Merdis Stalling, MD 01/10/24 1501

## 2024-05-07 ENCOUNTER — Other Ambulatory Visit: Payer: Self-pay | Admitting: Internal Medicine
